# Patient Record
Sex: Female | Born: 2000 | Race: Black or African American | Hispanic: No | Marital: Single | Smoking: Never smoker
Health system: Southern US, Community
[De-identification: ages and names within clinical notes are randomized; demographics above are authoritative.]

---
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
## 2019-12-27 ENCOUNTER — Encounter (HOSPITAL_BASED_OUTPATIENT_CLINIC_OR_DEPARTMENT_OTHER): Payer: Self-pay | Admitting: *Deleted

## 2019-12-27 ENCOUNTER — Other Ambulatory Visit: Payer: Self-pay

## 2019-12-27 DIAGNOSIS — M7041 Prepatellar bursitis, right knee: Secondary | ICD-10-CM | POA: Diagnosis not present

## 2019-12-27 DIAGNOSIS — M25561 Pain in right knee: Secondary | ICD-10-CM | POA: Diagnosis present

## 2019-12-27 DIAGNOSIS — Z5321 Procedure and treatment not carried out due to patient leaving prior to being seen by health care provider: Secondary | ICD-10-CM | POA: Insufficient documentation

## 2019-12-27 NOTE — ED Triage Notes (Signed)
Pt c/o right knee pain and arm pain x 6 days , denies injury

## 2019-12-28 ENCOUNTER — Emergency Department (HOSPITAL_BASED_OUTPATIENT_CLINIC_OR_DEPARTMENT_OTHER)
Admission: EM | Admit: 2019-12-28 | Discharge: 2019-12-29 | Disposition: A | Discharge status: Home / Self Care | Payer: Medicaid Other | Source: Home / Self Care | Attending: Emergency Medicine | Admitting: Emergency Medicine

## 2019-12-28 ENCOUNTER — Emergency Department (HOSPITAL_BASED_OUTPATIENT_CLINIC_OR_DEPARTMENT_OTHER)
Admission: EM | Admit: 2019-12-28 | Discharge: 2019-12-28 | Disposition: A | Discharge status: Home / Self Care | Payer: Medicaid Other | Attending: Emergency Medicine | Admitting: Emergency Medicine

## 2019-12-28 ENCOUNTER — Other Ambulatory Visit: Payer: Self-pay

## 2019-12-28 ENCOUNTER — Encounter (HOSPITAL_BASED_OUTPATIENT_CLINIC_OR_DEPARTMENT_OTHER): Payer: Self-pay

## 2019-12-28 DIAGNOSIS — M7041 Prepatellar bursitis, right knee: Secondary | ICD-10-CM | POA: Insufficient documentation

## 2019-12-28 DIAGNOSIS — Y939 Activity, unspecified: Secondary | ICD-10-CM | POA: Insufficient documentation

## 2019-12-28 DIAGNOSIS — M7051 Other bursitis of knee, right knee: Secondary | ICD-10-CM

## 2019-12-28 NOTE — ED Triage Notes (Signed)
Pt c/o swelling right knee and right forearm "hot and burning" x 4 days-unable to appreciate swelling to forearm-possible slight swelling to right lateral knee-denies injury to either site-NAD-steady gait

## 2019-12-29 MED ORDER — IBUPROFEN 800 MG PO TABS: 800.0000 mg | ORAL_TABLET | Freq: Four times a day (QID) | ORAL | 0 refills | Status: DC | PRN

## 2019-12-29 NOTE — ED Provider Notes (Signed)
[facility] EMERGENCY DEPARTMENT Provider Note   CSN: [id number] Arrival date & time: 12/28/19  2047     History Chief Complaint  Patient presents with  . Leg Swelling    Erica Faulkner is a 19 y.o. female.  Patient presents to the emergency department with multiple complaints.  Patient reports that 4 days ago she started to notice some swelling and heaviness of the right knee.  She denies any injury although she might have been doing a lot of kneeling right before symptoms started.  Around this time she also started to feel a fullness in her right forearm with intermittent tingling in the fingers and some locking up of the thumb.        History reviewed. No pertinent past medical history.  There are no problems to display for this patient.   History reviewed. No pertinent surgical history.   OB History   No obstetric history on file.     No family history on file.  Social History   Tobacco Use  . Smoking status: Never Smoker  . Smokeless tobacco: Never Used  Vaping Use  . Vaping Use: Never used  Substance Use Topics  . Alcohol use: Not Currently  . Drug use: Not Currently    Home Medications Prior to Admission medications   Medication Sig Start Date End Date Taking? Authorizing Provider  ibuprofen (ADVIL) 800 MG tablet Take 1 tablet (800 mg total) by mouth every 6 (six) hours as needed for moderate pain. 12/29/19   Gilda Crease, MD    Allergies    Patient has no known allergies.  Review of Systems   Review of Systems  Musculoskeletal: Positive for arthralgias and myalgias.  All other systems reviewed and are negative.   Physical Exam Updated Vital Signs BP 112/69 (BP Location: Left Arm)   Pulse 71   Temp 98.9 F (37.2 C) (Oral)   Resp 16   Ht 5\' 5"  (1.651 m)   Wt 54 kg   LMP 12/06/2019   SpO2 99%   BMI 19.80 kg/m   Physical Exam Vitals and nursing note reviewed.  Constitutional:      General: She is not in acute  distress.    Appearance: Normal appearance. She is well-developed.  HENT:     Head: Normocephalic and atraumatic.     Right Ear: Hearing normal.     Left Ear: Hearing normal.     Nose: Nose normal.  Eyes:     Conjunctiva/sclera: Conjunctivae normal.     Pupils: Pupils are equal, round, and reactive to light.  Cardiovascular:     Rate and Rhythm: Regular rhythm.     Heart sounds: S1 normal and S2 normal. No murmur heard.  No friction rub. No gallop.   Pulmonary:     Effort: Pulmonary effort is normal. No respiratory distress.     Breath sounds: Normal breath sounds.  Chest:     Chest wall: No tenderness.  Abdominal:     General: Bowel sounds are normal.     Palpations: Abdomen is soft.     Tenderness: There is no abdominal tenderness. There is no guarding or rebound. Negative signs include Murphy's sign and McBurney's sign.     Hernia: No hernia is present.  Musculoskeletal:        General: Normal range of motion.     Right forearm: No swelling, edema, deformity, lacerations, tenderness or bony tenderness.     Cervical back: Normal range of motion  and neck supple.     Right knee: No swelling, deformity, effusion, erythema, ecchymosis, lacerations, bony tenderness or crepitus. Normal range of motion. Tenderness (Lateral infrapatellar region) present. Normal alignment.  Skin:    General: Skin is warm and dry.     Findings: No rash.  Neurological:     Mental Status: She is alert and oriented to person, place, and time.     GCS: GCS eye subscore is 4. GCS verbal subscore is 5. GCS motor subscore is 6.     Cranial Nerves: No cranial nerve deficit.     Sensory: No sensory deficit.     Coordination: Coordination normal.  Psychiatric:        Speech: Speech normal.        Behavior: Behavior normal.        Thought Content: Thought content normal.     ED Results / Procedures / Treatments   Labs (all labs ordered are listed, but only abnormal results are displayed) Labs Reviewed -  No data to display  EKG None  Radiology No results found.  Procedures Procedures (including critical care time)  Medications Ordered in ED Medications - No data to display  ED Course  I have reviewed the triage vital signs and the nursing notes.  Pertinent labs & imaging results that were available during my care of the patient were reviewed by me and considered in my medical decision making (see chart for details).    MDM Rules/Calculators/A&P                          Patient with multiple complaints.  She reports that she feels some swelling and heaviness of the right forearm.  Patient indicates mid forearm on the volar aspect where the sensation is.  No overlying skin changes, no swelling.  She does not have any wrist or elbow complaints.  Normal range of motion at these joints.  No erythema, warmth or effusions.  Patient has normal grip strength, flexion extension at the wrist, abduction abduction opposition of the fingers.  Patient also complaining of a heavy feeling without actual pain of the right knee.  She reports that she has had some swelling.  Patient indicates the right infrapatellar area.  There is a slight fullness in this region but no fluctuance.  No overlying erythema, induration or warmth.  Knee joint is unremarkable.  She has full range of motion without effusion or joint margin tenderness.  No concern for septic arthritis.  She may have a slight infrapatellar bursitis but no sign of infection.  Recommend rest and anti-inflammatories.  Final Clinical Impression(s) / ED Diagnoses Final diagnoses:  Infrapatellar bursitis of right knee    Rx / DC Orders ED Discharge Orders         Ordered    ibuprofen (ADVIL) 800 MG tablet  Every 6 hours PRN        12/29/19 0029           Gilda Crease, MD 12/29/19 [phone]

## 2019-12-31 ENCOUNTER — Other Ambulatory Visit: Payer: Self-pay

## 2019-12-31 ENCOUNTER — Emergency Department (HOSPITAL_COMMUNITY)
Admission: EM | Admit: 2019-12-31 | Discharge: 2019-12-31 | Disposition: A | Discharge status: Home / Self Care | Payer: Medicaid Other | Attending: Emergency Medicine | Admitting: Emergency Medicine

## 2019-12-31 ENCOUNTER — Emergency Department (HOSPITAL_COMMUNITY): Payer: Medicaid Other

## 2019-12-31 ENCOUNTER — Encounter (HOSPITAL_COMMUNITY): Payer: Self-pay

## 2019-12-31 DIAGNOSIS — M25461 Effusion, right knee: Secondary | ICD-10-CM | POA: Diagnosis not present

## 2019-12-31 DIAGNOSIS — M940 Chondrocostal junction syndrome [Tietze]: Secondary | ICD-10-CM | POA: Diagnosis not present

## 2019-12-31 DIAGNOSIS — R0981 Nasal congestion: Secondary | ICD-10-CM | POA: Insufficient documentation

## 2019-12-31 DIAGNOSIS — R0789 Other chest pain: Secondary | ICD-10-CM | POA: Diagnosis present

## 2019-12-31 MED ORDER — NAPROXEN 375 MG PO TABS: 375.0000 mg | ORAL_TABLET | Freq: Two times a day (BID) | ORAL | 0 refills | Status: DC

## 2019-12-31 MED ORDER — GUAIFENESIN ER 600 MG PO TB12: 600.0000 mg | ORAL_TABLET | Freq: Two times a day (BID) | ORAL | 0 refills | Status: DC

## 2019-12-31 NOTE — ED Triage Notes (Signed)
Patient complains of dry cough, no fever, no sore throat, NAD. States that her chest hurts with inspiration. Patient also complains of right knee pain after dancing with swelling x 7 days

## 2019-12-31 NOTE — ED Notes (Signed)
Patient verbalizes understanding of discharge instructions . Opportunity for questions and answers were provided . Armband removed by staff ,Pt discharged from ED. W/C  offered at D/C  and Declined W/C at D/C and was escorted to lobby by RN.  

## 2019-12-31 NOTE — Discharge Instructions (Addendum)
Take naproxen as needed as directed. You can also take Mucinex for chest congestion. Follow-up with your PCP for recheck.

## 2019-12-31 NOTE — ED Provider Notes (Signed)
[facility] EMERGENCY DEPARTMENT Provider Note   CSN: [id number] Arrival date & time: 12/31/19  1108     History No chief complaint on file.   Erica Faulkner is a 19 y.o. female.  19 year old female presents to the emergency room with complaint of left side chest wall pain and back pain as well as swelling in her right knee. Patient feels like she has mucus in her chest that she is forcefully coughing up at times otherwise does not have a cough or respiratory symptoms. Pain in the chest wall and the back are worse with palpation. Also reports occasional pain in her right wrist/hand, again, no injury or swelling.  Also reports pain in her right knee, states that her right knee is swollen and suspect she has fluid in her knee that needs to be drained out. Denies injury to the knee, states she dances frequently, no fevers. No other complaints or concerns.        History reviewed. No pertinent past medical history.  There are no problems to display for this patient.   History reviewed. No pertinent surgical history.   OB History   No obstetric history on file.     No family history on file.  Social History   Tobacco Use  . Smoking status: Never Smoker  . Smokeless tobacco: Never Used  Vaping Use  . Vaping Use: Never used  Substance Use Topics  . Alcohol use: Not Currently  . Drug use: Not Currently    Home Medications Prior to Admission medications   Medication Sig Start Date End Date Taking? Authorizing Provider  guaiFENesin (MUCINEX) 600 MG 12 hr tablet Take 1 tablet (600 mg total) by mouth 2 (two) times daily. 12/31/19   Jeannie Fend, PA-C  naproxen (NAPROSYN) 375 MG tablet Take 1 tablet (375 mg total) by mouth 2 (two) times daily. 12/31/19   Jeannie Fend, PA-C    Allergies    Patient has no known allergies.  Review of Systems   Review of Systems  Constitutional: Negative for fever.  HENT: Positive for congestion. Negative for  postnasal drip, rhinorrhea, sinus pressure and sinus pain.   Respiratory: Negative for cough, shortness of breath and wheezing.   Cardiovascular: Negative for chest pain.  Gastrointestinal: Negative for nausea and vomiting.  Musculoskeletal: Positive for arthralgias and joint swelling. Negative for myalgias.  Skin: Negative for color change, rash and wound.  Allergic/Immunologic: Negative for immunocompromised state.  Neurological: Negative for weakness and numbness.  Psychiatric/Behavioral: Negative for confusion.  All other systems reviewed and are negative.   Physical Exam Updated Vital Signs BP 113/84   Pulse (!) 105   Temp 99 F (37.2 C) (Oral)   Resp 17   Ht 5\' 6"  (1.676 m)   Wt 54 kg   LMP 12/06/2019   SpO2 100%   BMI 19.21 kg/m   Physical Exam Vitals and nursing note reviewed.  Constitutional:      General: She is not in acute distress.    Appearance: She is well-developed. She is not diaphoretic.  HENT:     Head: Normocephalic and atraumatic.  Cardiovascular:     Rate and Rhythm: Normal rate and regular rhythm.     Pulses: Normal pulses.     Heart sounds: Normal heart sounds.  Pulmonary:     Effort: Pulmonary effort is normal.     Breath sounds: Normal breath sounds.  Musculoskeletal:        General: Tenderness present.  No deformity or signs of injury. Normal range of motion.     Right wrist: Normal.     Left wrist: Normal.     Right hand: Normal.     Left hand: Normal.     Cervical back: Normal.       Back:     Right knee: Effusion present. No erythema, bony tenderness or crepitus. Normal range of motion. Tenderness present. No LCL laxity or MCL laxity.     Left knee: Normal.     Right ankle: Normal.     Left ankle: Normal.     Comments: Small effusion right knee without overlying erythema, patient is able to fully flex and extend leg without pain  Skin:    General: Skin is warm and dry.     Findings: No bruising, erythema, lesion or rash.   Neurological:     Mental Status: She is alert and oriented to person, place, and time.     Motor: No weakness.     Gait: Gait normal.  Psychiatric:        Behavior: Behavior normal.     ED Results / Procedures / Treatments   Labs (all labs ordered are listed, but only abnormal results are displayed) Labs Reviewed - No data to display  EKG None  Radiology DG Chest 2 View  Result Date: 12/31/2019 CLINICAL DATA:  Cough EXAM: CHEST - 2 VIEW COMPARISON:  None. FINDINGS: The heart size and mediastinal contours are within normal limits. Both lungs are clear. The visualized skeletal structures are unremarkable. IMPRESSION: No active cardiopulmonary disease.  No evidence of pneumonia. Electronically Signed   By: Bary Richard M.D.   On: 12/31/2019 12:35   DG Knee Complete 4 Views Right  Result Date: 12/31/2019 CLINICAL DATA:  Possible injury 6 days ago.  Swelling. EXAM: RIGHT KNEE - COMPLETE 4+ VIEW COMPARISON:  None. FINDINGS: Osseous alignment is normal. Bone mineralization is normal. No fracture line or displaced fracture fragment. No acute or suspicious osseous lesion. No appreciable joint effusion and adjacent soft tissues are unremarkable. IMPRESSION: Negative. Electronically Signed   By: Bary Richard M.D.   On: 12/31/2019 12:35    Procedures Procedures (including critical care time)  Medications Ordered in ED Medications - No data to display  ED Course  I have reviewed the triage vital signs and the nursing notes.  Pertinent labs & imaging results that were available during my care of the patient were reviewed by me and considered in my medical decision making (see chart for details).  Clinical Course as of Dec 31 1406  Sat Dec 31, 2019  3371 19 year old female presents to the ER with complaint of feeling like she has mucus in her chest resulting in a forceful cough with clear sputum.  Otherwise, states she does not actually have a cough or URI symptoms.  Also reports  left-sided chest and back pain, worse with palpation, found to have musculoskeletal tenderness on exam with clear lungs.  Chest x-ray unremarkable. Also reports swelling in her right knee which she states may be from dancing, not improving with ibuprofen, found to have a questionably small effusion in the right knee without overlying erythema with normal range of motion of the knee and no ligamentous laxity.  X-ray of the knee is unremarkable. Patient also reports pains in her right wrist and hand with a normal exam. Review of records, [facility] 2 days ago with similar complaint, had full work-up including  labs which was unremarkable.  Also seen the day before that at this facility with a normal work-up and discharged with ibuprofen. Offered patient naproxen in place of ibuprofen but the ibuprofen is not helping.  Also offered Mucinex for her perceived chest congestion. Eventually follow-up with a primary care provider.   [LM]    Clinical Course User Index [LM] Alden Hipp   MDM Rules/Calculators/A&P                          Final Clinical Impression(s) / ED Diagnoses Final diagnoses:  Costochondritis, acute  Effusion of right knee    Rx / DC Orders ED Discharge Orders         Ordered    naproxen (NAPROSYN) 375 MG tablet  2 times daily        12/31/19 1346    guaiFENesin (MUCINEX) 600 MG 12 hr tablet  2 times daily        12/31/19 1358           Alden Hipp 12/31/19 1408    Eber Hong, MD 12/31/19 2005

## 2020-01-04 ENCOUNTER — Emergency Department (HOSPITAL_BASED_OUTPATIENT_CLINIC_OR_DEPARTMENT_OTHER)
Admission: EM | Admit: 2020-01-04 | Discharge: 2020-01-05 | Disposition: A | Discharge status: Home / Self Care | Payer: Medicaid Other | Attending: Emergency Medicine | Admitting: Emergency Medicine

## 2020-01-04 ENCOUNTER — Encounter (HOSPITAL_BASED_OUTPATIENT_CLINIC_OR_DEPARTMENT_OTHER): Payer: Self-pay | Admitting: *Deleted

## 2020-01-04 ENCOUNTER — Other Ambulatory Visit: Payer: Self-pay

## 2020-01-04 DIAGNOSIS — Z5321 Procedure and treatment not carried out due to patient leaving prior to being seen by health care provider: Secondary | ICD-10-CM | POA: Insufficient documentation

## 2020-01-04 DIAGNOSIS — R0789 Other chest pain: Secondary | ICD-10-CM | POA: Insufficient documentation

## 2020-01-04 MED ORDER — ACETAMINOPHEN 500 MG PO TABS: 1000.0000 mg | ORAL_TABLET | Freq: Once | ORAL | Status: DC

## 2020-01-04 MED ORDER — LIDOCAINE 5 % EX PTCH: 1.0000 | MEDICATED_PATCH | CUTANEOUS | Status: DC

## 2020-01-04 NOTE — ED Triage Notes (Addendum)
Pt c/o right sided chest pain with deep breathing , seen 9/4 for same DX costochondritis chest xray neg  . Pt states insaids not working

## 2020-01-29 ENCOUNTER — Other Ambulatory Visit: Payer: Self-pay

## 2020-01-29 ENCOUNTER — Emergency Department (HOSPITAL_BASED_OUTPATIENT_CLINIC_OR_DEPARTMENT_OTHER)
Admission: EM | Admit: 2020-01-29 | Discharge: 2020-01-29 | Disposition: A | Discharge status: Home / Self Care | Payer: Medicaid Other | Attending: Emergency Medicine | Admitting: Emergency Medicine

## 2020-01-29 ENCOUNTER — Encounter (HOSPITAL_BASED_OUTPATIENT_CLINIC_OR_DEPARTMENT_OTHER): Payer: Self-pay | Admitting: Emergency Medicine

## 2020-01-29 DIAGNOSIS — J029 Acute pharyngitis, unspecified: Secondary | ICD-10-CM | POA: Insufficient documentation

## 2020-01-29 DIAGNOSIS — R0789 Other chest pain: Secondary | ICD-10-CM

## 2020-01-29 DIAGNOSIS — R0981 Nasal congestion: Secondary | ICD-10-CM | POA: Diagnosis present

## 2020-01-29 LAB — GROUP A STREP BY PCR: Group A Strep by PCR: NOT DETECTED

## 2020-01-29 MED ORDER — IBUPROFEN 400 MG PO TABS
400.0000 mg | ORAL_TABLET | Freq: Once | ORAL | Status: AC
  Administered 2020-01-29: 400 mg via ORAL
  Filled 2020-01-29: qty 1

## 2020-01-29 NOTE — ED Provider Notes (Signed)
[facility] EMERGENCY DEPARTMENT Provider Note   CSN: [id number] Arrival date & time: 01/29/20  0848     History Chief Complaint  Patient presents with  . Sore Throat    Erica Faulkner is a 19 y.o. female.  Patient c/o sore throat for the past week. Symptoms acute onset, moderate, constant, persistent, dull, non radiating. No trouble breathing or swallowing. No fever/chills. No ear pain. No headache. Denies cough or other uri symptoms. No known ill contacts, no known mono or strep exposure. Patient also notes hx 'costochondritis', notes occasional right upper chest pain - dull, mild, localized, worse w palpation chest wall or w certain movements. No exertional chest pain. No pleuritic chest pain. No heartburn. No leg pain or swelling. No hx dvt or pe. No chest wall injury or strain.   The history is provided by the patient.  Sore Throat Pertinent negatives include no abdominal pain, no headaches and no shortness of breath.       History reviewed. No pertinent past medical history.  There are no problems to display for this patient.   History reviewed. No pertinent surgical history.   OB History   No obstetric history on file.     History reviewed. No pertinent family history.  Social History   Tobacco Use  . Smoking status: Never Smoker  . Smokeless tobacco: Never Used  Vaping Use  . Vaping Use: Never used  Substance Use Topics  . Alcohol use: Not Currently  . Drug use: Not Currently    Home Medications Prior to Admission medications   Medication Sig Start Date End Date Taking? Authorizing Provider  guaiFENesin (MUCINEX) 600 MG 12 hr tablet Take 1 tablet (600 mg total) by mouth 2 (two) times daily. 12/31/19   Jeannie Fend, PA-C  naproxen (NAPROSYN) 375 MG tablet Take 1 tablet (375 mg total) by mouth 2 (two) times daily. 12/31/19   Jeannie Fend, PA-C    Allergies    Patient has no known allergies.  Review of Systems   Review of Systems   Constitutional: Negative for chills, diaphoresis and fever.  HENT: Positive for sore throat. Negative for trouble swallowing.   Eyes: Negative for redness.  Respiratory: Negative for cough and shortness of breath.   Cardiovascular: Negative for leg swelling.  Gastrointestinal: Negative for abdominal pain, nausea and vomiting.  Genitourinary: Negative for flank pain.  Musculoskeletal: Negative for back pain and neck pain.  Skin: Negative for rash.  Neurological: Negative for headaches.  Hematological: Does not bruise/bleed easily.  Psychiatric/Behavioral: Negative for confusion.    Physical Exam Updated Vital Signs BP 112/82 (BP Location: Right Arm)   Pulse 93   Temp 98.6 F (37 C) (Oral)   Resp 16   Ht 1.676 m (5\' 6" )   Wt 52.2 kg   LMP 01/06/2020 (Approximate)   SpO2 100%   BMI 18.56 kg/m   Physical Exam Vitals and nursing note reviewed.  Constitutional:      Appearance: Normal appearance. She is well-developed.  HENT:     Head: Atraumatic.     Right Ear: Tympanic membrane normal.     Left Ear: Tympanic membrane normal.     Nose: Nose normal.     Mouth/Throat:     Mouth: Mucous membranes are moist.     Pharynx: Oropharynx is clear. No oropharyngeal exudate or posterior oropharyngeal erythema.     Comments: No swelling. No peritonsillar abscess. Normal voice.  Eyes:     General: No  scleral icterus.    Conjunctiva/sclera: Conjunctivae normal.  Neck:     Trachea: No tracheal deviation.     Comments: No anterior or posterior cervical l/a.  Cardiovascular:     Rate and Rhythm: Normal rate and regular rhythm.     Pulses: Normal pulses.     Heart sounds: Normal heart sounds. No murmur heard.  No friction rub. No gallop.   Pulmonary:     Effort: Pulmonary effort is normal. No respiratory distress.     Breath sounds: Normal breath sounds. No stridor.     Comments: Mild localized right upper chest wall tenderness. No sts. No crepitus.  Abdominal:     General: There  is no distension.     Palpations: Abdomen is soft.     Tenderness: There is no abdominal tenderness.  Genitourinary:    Comments: No cva tenderness.  Musculoskeletal:        General: No swelling.     Cervical back: Normal range of motion and neck supple. No rigidity. No muscular tenderness.     Right lower leg: No edema.     Left lower leg: No edema.  Lymphadenopathy:     Cervical: No cervical adenopathy.  Skin:    General: Skin is warm and dry.     Findings: No rash.  Neurological:     Mental Status: She is alert.     Comments: Alert, speech normal.   Psychiatric:        Mood and Affect: Mood normal.     ED Results / Procedures / Treatments   Labs (all labs ordered are listed, but only abnormal results are displayed) Results for orders placed or performed during the hospital encounter of 01/29/20  Group A Strep by PCR   Specimen: Throat; Sterile Swab  Result Value Ref Range   Group A Strep by PCR NOT DETECTED NOT DETECTED   DG Chest 2 View  Result Date: 12/31/2019 CLINICAL DATA:  Cough EXAM: CHEST - 2 VIEW COMPARISON:  None. FINDINGS: The heart size and mediastinal contours are within normal limits. Both lungs are clear. The visualized skeletal structures are unremarkable. IMPRESSION: No active cardiopulmonary disease.  No evidence of pneumonia. Electronically Signed   By: Bary Richard M.D.   On: 12/31/2019 12:35   DG Knee Complete 4 Views Right  Result Date: 12/31/2019 CLINICAL DATA:  Possible injury 6 days ago.  Swelling. EXAM: RIGHT KNEE - COMPLETE 4+ VIEW COMPARISON:  None. FINDINGS: Osseous alignment is normal. Bone mineralization is normal. No fracture line or displaced fracture fragment. No acute or suspicious osseous lesion. No appreciable joint effusion and adjacent soft tissues are unremarkable. IMPRESSION: Negative. Electronically Signed   By: Bary Richard M.D.   On: 12/31/2019 12:35    EKG None  Radiology No results found.  Procedures Procedures (including  critical care time)  Medications Ordered in ED Medications  ibuprofen (ADVIL) tablet 400 mg (has no administration in time range)    ED Course  I have reviewed the triage vital signs and the nursing notes.  Pertinent labs & imaging results that were available during my care of the patient were reviewed by me and considered in my medical decision making (see chart for details).    MDM Rules/Calculators/A&P                         Ibuprofen po. Strep test sent.   Reviewed nursing notes and prior charts for additional history.  Recent  cxr, neg for acute process.  Pt breathing comfortably, swallowing comfortably. Pharynx unremarkable.   Labs reviewed/interpreted by me - strep test negative.   Patient appears stable for d/c.   Rec pcp f/u 1-2 weeks if symptoms fail to improve/resolve.   Final Clinical Impression(s) / ED Diagnoses Final diagnoses:  None    Rx / DC Orders ED Discharge Orders    None       Cathren Laine, MD 01/29/20 1014

## 2020-01-29 NOTE — ED Triage Notes (Signed)
Pt reports sore throat nasal congestion, and R upper CP that was previously treated and rx for naproxen. Pt denies shob, denies cough.

## 2020-01-29 NOTE — ED Notes (Signed)
Given ginger ale 

## 2020-01-29 NOTE — ED Notes (Signed)
Pt discharged to home. Discharge instructions have been discussed with patient and/or family members. Pt verbally acknowledges understanding d/c instructions, and endorses comprehension to checkout at registration before leaving.  °

## 2020-01-29 NOTE — Discharge Instructions (Signed)
It was our pleasure to provide your ER care today - we hope that you feel better.  Your strep test is negative.   Take acetaminophen or ibuprofen as need. Try throat lozenges as need.   Follow up with primary care doctor in 1-2 weeks if symptoms fail to improve/resolve.  Return to ER if worse, new symptoms, fevers, severe throat pain, trouble breathing or swallowing, or other concern.

## 2020-12-23 ENCOUNTER — Encounter (HOSPITAL_BASED_OUTPATIENT_CLINIC_OR_DEPARTMENT_OTHER): Payer: Self-pay | Admitting: Emergency Medicine

## 2020-12-23 ENCOUNTER — Emergency Department (HOSPITAL_BASED_OUTPATIENT_CLINIC_OR_DEPARTMENT_OTHER)
Admission: EM | Admit: 2020-12-23 | Discharge: 2020-12-24 | Disposition: A | Discharge status: Home / Self Care | Payer: Medicaid Other | Attending: Emergency Medicine | Admitting: Emergency Medicine

## 2020-12-23 ENCOUNTER — Other Ambulatory Visit: Payer: Self-pay

## 2020-12-23 DIAGNOSIS — M26629 Arthralgia of temporomandibular joint, unspecified side: Secondary | ICD-10-CM | POA: Diagnosis not present

## 2020-12-23 DIAGNOSIS — H9202 Otalgia, left ear: Secondary | ICD-10-CM | POA: Insufficient documentation

## 2020-12-23 DIAGNOSIS — R6884 Jaw pain: Secondary | ICD-10-CM | POA: Diagnosis present
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
## 2020-12-24 MED ORDER — CYCLOBENZAPRINE HCL 10 MG PO TABS: 10.0000 mg | ORAL_TABLET | Freq: Three times a day (TID) | ORAL | 0 refills | Status: AC | PRN
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
## 2023-12-29 ENCOUNTER — Other Ambulatory Visit: Payer: Self-pay

## 2023-12-29 ENCOUNTER — Encounter (HOSPITAL_BASED_OUTPATIENT_CLINIC_OR_DEPARTMENT_OTHER): Payer: Self-pay | Admitting: Emergency Medicine

## 2023-12-29 ENCOUNTER — Emergency Department (HOSPITAL_BASED_OUTPATIENT_CLINIC_OR_DEPARTMENT_OTHER)
Admission: EM | Admit: 2023-12-29 | Discharge: 2023-12-29 | Discharge status: Against Medical Advice | Attending: Emergency Medicine | Admitting: Emergency Medicine

## 2023-12-29 DIAGNOSIS — Z5321 Procedure and treatment not carried out due to patient leaving prior to being seen by health care provider: Secondary | ICD-10-CM | POA: Insufficient documentation

## 2023-12-29 DIAGNOSIS — R109 Unspecified abdominal pain: Secondary | ICD-10-CM | POA: Diagnosis present

## 2023-12-29 LAB — LIPASE, BLOOD: Lipase: 25 U/L (ref 11–51)

## 2023-12-29 LAB — URINALYSIS, ROUTINE W REFLEX MICROSCOPIC
Glucose, UA: NEGATIVE mg/dL
Ketones, ur: 15 mg/dL — AB
Leukocytes,Ua: NEGATIVE
Nitrite: NEGATIVE
Protein, ur: 30 mg/dL — AB
Specific Gravity, Urine: >=1.03 (ref 1.005–1.030)
pH: 6 (ref 5.0–8.0)

## 2023-12-29 LAB — CBC
HCT: 37.7 % (ref 36.0–46.0)
Hemoglobin: 12.1 g/dL (ref 12.0–15.0)
MCH: 29.7 pg (ref 26.0–34.0)
MCHC: 32.1 g/dL (ref 30.0–36.0)
MCV: 92.6 fL (ref 80.0–100.0)
Platelets: 380 K/uL (ref 150–400)
RBC: 4.07 MIL/uL (ref 3.87–5.11)
RDW: 13.4 % (ref 11.5–15.5)
WBC: 7.4 K/uL (ref 4.0–10.5)
nRBC: 0 % (ref 0.0–0.2)

## 2023-12-29 LAB — COMPREHENSIVE METABOLIC PANEL WITH GFR
ALT: 10 U/L (ref 0–44)
AST: 19 U/L (ref 15–41)
Albumin: 4.2 g/dL (ref 3.5–5.0)
Alkaline Phosphatase: 111 U/L (ref 38–126)
Anion gap: 11 (ref 5–15)
BUN: 12 mg/dL (ref 6–20)
CO2: 22 mmol/L (ref 22–32)
Calcium: 9.7 mg/dL (ref 8.9–10.3)
Chloride: 105 mmol/L (ref 98–111)
Creatinine, Ser: 0.92 mg/dL (ref 0.44–1.00)
GFR, Estimated: >60 mL/min (ref >60)
Glucose, Bld: 93 mg/dL (ref 70–99)
Potassium: 4.4 mmol/L (ref 3.5–5.1)
Sodium: 137 mmol/L (ref 135–145)
Total Bilirubin: <0.2 mg/dL (ref 0.0–1.2)
Total Protein: 7.7 g/dL (ref 6.5–8.1)

## 2023-12-29 LAB — URINALYSIS, MICROSCOPIC (REFLEX)

## 2023-12-29 NOTE — ED Triage Notes (Signed)
 Pt c/o abd pain, reports medicated abortion Friday.   Denies n/v/d/fever.

## 2024-04-19 ENCOUNTER — Encounter (HOSPITAL_BASED_OUTPATIENT_CLINIC_OR_DEPARTMENT_OTHER): Payer: Self-pay

## 2024-04-19 ENCOUNTER — Other Ambulatory Visit: Payer: Self-pay

## 2024-04-19 ENCOUNTER — Emergency Department (HOSPITAL_BASED_OUTPATIENT_CLINIC_OR_DEPARTMENT_OTHER)
Admission: EM | Admit: 2024-04-19 | Discharge: 2024-04-19 | Disposition: A | Discharge status: Home / Self Care | Attending: Emergency Medicine | Admitting: Emergency Medicine

## 2024-04-19 DIAGNOSIS — J069 Acute upper respiratory infection, unspecified: Secondary | ICD-10-CM | POA: Diagnosis not present

## 2024-04-19 DIAGNOSIS — R059 Cough, unspecified: Secondary | ICD-10-CM | POA: Diagnosis present

## 2024-04-19 LAB — RESP PANEL BY RT-PCR (RSV, FLU A&B, COVID)  RVPGX2
Influenza A by PCR: NEGATIVE
Influenza B by PCR: NEGATIVE
Resp Syncytial Virus by PCR: NEGATIVE
SARS Coronavirus 2 by RT PCR: NEGATIVE

## 2024-04-19 MED ORDER — DEXAMETHASONE 1 MG/ML PO CONC: 10.0000 mg | Freq: Once | ORAL | Status: DC

## 2024-04-19 MED ORDER — GUAIFENESIN ER 600 MG PO TB12: 1200.0000 mg | ORAL_TABLET | Freq: Two times a day (BID) | ORAL | 0 refills | Status: AC

## 2024-04-19 MED ORDER — FLUTICASONE PROPIONATE 50 MCG/ACT NA SUSP: 2.0000 | Freq: Every day | NASAL | 0 refills | Status: AC

## 2024-04-19 MED ORDER — DEXAMETHASONE 10 MG/ML FOR PEDIATRIC ORAL USE
10.0000 mg | Freq: Once | INTRAMUSCULAR | Status: AC
  Administered 2024-04-19: 10 mg via ORAL

## 2024-04-19 NOTE — ED Triage Notes (Signed)
 Cough, congestion, fatigue for 5 days

## 2024-04-19 NOTE — Discharge Instructions (Signed)
 Today you were seen for a viral upper respiratory infection.  You may alternate taking Tylenol  and Motrin  as needed for fever and pain, Flonase  as needed for nasal congestion, and plain Mucinex  as needed for chest congestion.  Thank you for letting us  treat you today. After reviewing your labs and imaging, I feel you are safe to go home. Please follow up with your PCP in the next several days and provide them with your records from this visit. Return to the Emergency Room if pain becomes severe or symptoms worsen.

## 2024-04-19 NOTE — ED Provider Notes (Signed)
 " [place] [facility] Provider Note   CSN: [id number] Arrival date & time: 04/19/24  0930     Patient presents with: Cough   Erica Faulkner is a 23 y.o. female.  Presents today for fatigue, cough, sore throat and congestion x 5 days.  Patient denies nausea, vomiting, fever, chest pain, shortness of breath, any other complaints at this time.    Cough      Prior to Admission medications  Medication Sig Start Date End Date Taking? Authorizing Provider  brompheniramine-pseudoephedrine-DM 30-2-10 MG/5ML syrup Take 5 mLs by mouth every 8 (eight) hours. 04/18/24  Yes [provider]  cefdinir (OMNICEF) 300 MG capsule Take 300 mg by mouth 2 (two) times daily. 04/18/24  Yes [provider]  fluticasone  (FLONASE ) 50 MCG/ACT nasal spray Place 2 sprays into both nostrils daily. 04/19/24  Yes Rosemary Mossbarger N, PA-C  guaiFENesin  (MUCINEX ) 600 MG 12 hr tablet Take 2 tablets (1,200 mg total) by mouth 2 (two) times daily for 10 days. 04/19/24 04/29/24 Yes Antoin Dargis N, PA-C  metroNIDAZOLE (FLAGYL) 500 MG tablet Take by mouth. 04/02/24  Yes [provider]  cyclobenzaprine  (FLEXERIL ) 10 MG tablet Take 1 tablet (10 mg total) by mouth 3 (three) times daily as needed (for jaw spasms). 12/24/20   Molpus, Norleen, MD    Allergies: Patient has no known allergies.    Review of Systems  Constitutional:  Positive for fatigue.  HENT:  Positive for congestion.   Respiratory:  Positive for cough.     Updated Vital Signs BP 101/76   Pulse 85   Temp 98.2 F (36.8 C) (Oral)   Resp 20   LMP 03/12/2024 (Approximate)   SpO2 100%   Physical Exam Vitals and nursing note reviewed.  Constitutional:      General: She is not in acute distress.    Appearance: She is well-developed. She is not toxic-appearing.  HENT:     Head: Normocephalic and atraumatic.     Right Ear: External ear normal.     Left Ear: External ear normal.     Nose:  Congestion and rhinorrhea present.     Mouth/Throat:     Mouth: Mucous membranes are moist.     Pharynx: Posterior oropharyngeal erythema present. No oropharyngeal exudate.  Eyes:     Conjunctiva/sclera: Conjunctivae normal.  Cardiovascular:     Rate and Rhythm: Normal rate and regular rhythm.     Pulses: Normal pulses.     Heart sounds: Normal heart sounds. No murmur heard. Pulmonary:     Effort: Pulmonary effort is normal. No respiratory distress.     Breath sounds: Normal breath sounds.  Abdominal:     Palpations: Abdomen is soft.     Tenderness: There is no abdominal tenderness.  Musculoskeletal:        General: No swelling.     Cervical back: Neck supple.  Skin:    General: Skin is warm and dry.     Capillary Refill: Capillary refill takes less than 2 seconds.  Neurological:     General: No focal deficit present.     Mental Status: She is alert and oriented to person, place, and time.  Psychiatric:        Mood and Affect: Mood normal.     (all labs ordered are listed, but only abnormal results are displayed) Labs Reviewed  RESP PANEL BY RT-PCR (RSV, FLU A&B, COVID)  [id number]    EKG: None  Radiology: No  results found.   Procedures   Medications Ordered in the ED  dexamethasone  (DECADRON ) 1 MG/ML solution 10 mg (has no administration in time range)                                    Medical Decision Making  This patient presents to the ED for concern of URI Sxs differential diagnosis includes COVID, flu, RSV, viral URI    Additional history obtained Additional history obtained from Electronic Medical Record External records from outside source obtained and reviewed including internal medicine notes   Lab Tests:  I Ordered, and personally interpreted labs.  The pertinent results include: Respiratory panel negative   Medicines ordered and prescription drug management:  I ordered medication including Decadron  I have reviewed the patients home  medicines and have made adjustments as needed   Problem List / ED Course:  Considered for admission or further workup however patients vital signs, physical exam, labs, and imaging are reassuring. Patient's symptoms likely due to a viral upper respiratory infection. Patient advised to take Tylenol /Motrin  as needed for fever, Flonase  as needed for nasal congestion, and plain Mucinex  as needed for chest congestion. Patient should follow-up with their primary care in the upcoming week if their symptoms persist for further evaluation and workup.        Final diagnoses:  Viral URI with cough    ED Discharge Orders          Ordered    guaiFENesin  (MUCINEX ) 600 MG 12 hr tablet  2 times daily        04/19/24 1102    fluticasone  (FLONASE ) 50 MCG/ACT nasal spray  Daily        04/19/24 1102               Francis Ileana SAILOR, PA-C 04/19/24 1104    Emil Share, DO 04/19/24 1105  "
